# Patient Record
Sex: Female | Born: 1970 | Race: White | Hispanic: Yes | Marital: Married | Smoking: Never smoker
Health system: Southern US, Community
[De-identification: ages and names within clinical notes are randomized; demographics above are authoritative.]

## PROBLEM LIST (undated history)

## (undated) DIAGNOSIS — E119 Type 2 diabetes mellitus without complications: Secondary | ICD-10-CM

## (undated) DIAGNOSIS — E785 Hyperlipidemia, unspecified: Secondary | ICD-10-CM

---

## 2007-11-20 ENCOUNTER — Ambulatory Visit (HOSPITAL_COMMUNITY): Admission: RE | Admit: 2007-11-20 | Discharge: 2007-11-20 | Payer: Self-pay | Admitting: Obstetrics and Gynecology

## 2007-12-26 ENCOUNTER — Ambulatory Visit (HOSPITAL_COMMUNITY): Admission: RE | Admit: 2007-12-26 | Discharge: 2007-12-26 | Payer: Self-pay | Admitting: Obstetrics and Gynecology

## 2008-01-23 ENCOUNTER — Ambulatory Visit (HOSPITAL_COMMUNITY): Admission: RE | Admit: 2008-01-23 | Discharge: 2008-01-23 | Payer: Self-pay | Admitting: Obstetrics and Gynecology

## 2010-10-22 ENCOUNTER — Encounter: Payer: Self-pay | Admitting: *Deleted

## 2010-10-22 ENCOUNTER — Encounter: Payer: Self-pay | Admitting: Obstetrics and Gynecology

## 2019-02-22 ENCOUNTER — Emergency Department (HOSPITAL_COMMUNITY): Payer: HRSA Program

## 2019-02-22 ENCOUNTER — Encounter (HOSPITAL_COMMUNITY): Payer: Self-pay

## 2019-02-22 ENCOUNTER — Emergency Department (HOSPITAL_COMMUNITY)
Admission: EM | Admit: 2019-02-22 | Discharge: 2019-02-22 | Disposition: A | Discharge status: Home / Self Care | Payer: HRSA Program | Attending: Emergency Medicine | Admitting: Emergency Medicine

## 2019-02-22 ENCOUNTER — Other Ambulatory Visit: Payer: Self-pay

## 2019-02-22 DIAGNOSIS — R079 Chest pain, unspecified: Secondary | ICD-10-CM | POA: Diagnosis present

## 2019-02-22 DIAGNOSIS — Z7984 Long term (current) use of oral hypoglycemic drugs: Secondary | ICD-10-CM | POA: Diagnosis not present

## 2019-02-22 DIAGNOSIS — J1289 Other viral pneumonia: Secondary | ICD-10-CM | POA: Insufficient documentation

## 2019-02-22 DIAGNOSIS — Z79899 Other long term (current) drug therapy: Secondary | ICD-10-CM | POA: Diagnosis not present

## 2019-02-22 DIAGNOSIS — E119 Type 2 diabetes mellitus without complications: Secondary | ICD-10-CM | POA: Insufficient documentation

## 2019-02-22 DIAGNOSIS — U071 COVID-19: Secondary | ICD-10-CM | POA: Insufficient documentation

## 2019-02-22 DIAGNOSIS — Z7982 Long term (current) use of aspirin: Secondary | ICD-10-CM | POA: Diagnosis not present

## 2019-02-22 DIAGNOSIS — J129 Viral pneumonia, unspecified: Secondary | ICD-10-CM

## 2019-02-22 DIAGNOSIS — Z20822 Contact with and (suspected) exposure to covid-19: Secondary | ICD-10-CM

## 2019-02-22 HISTORY — DX: Type 2 diabetes mellitus without complications: E11.9

## 2019-02-22 HISTORY — DX: Hyperlipidemia, unspecified: E78.5

## 2019-02-22 LAB — CBC
HCT: 44.2 % (ref 36.0–46.0)
Hemoglobin: 14.8 g/dL (ref 12.0–15.0)
MCH: 28.8 pg (ref 26.0–34.0)
MCHC: 33.5 g/dL (ref 30.0–36.0)
MCV: 86.2 fL (ref 80.0–100.0)
Platelets: 156 K/uL (ref 150–400)
RBC: 5.13 MIL/uL — ABNORMAL HIGH (ref 3.87–5.11)
RDW: 15.1 % (ref 11.5–15.5)
WBC: 4.7 K/uL (ref 4.0–10.5)
nRBC: 0 % (ref 0.0–0.2)

## 2019-02-22 LAB — I-STAT BETA HCG BLOOD, ED (MC, WL, AP ONLY): I-stat hCG, quantitative: <5 m[IU]/mL

## 2019-02-22 LAB — BASIC METABOLIC PANEL WITH GFR
Anion gap: 11 (ref 5–15)
BUN: 9 mg/dL (ref 6–20)
CO2: 22 mmol/L (ref 22–32)
Calcium: 9.1 mg/dL (ref 8.9–10.3)
Chloride: 106 mmol/L (ref 98–111)
Creatinine, Ser: 0.66 mg/dL (ref 0.44–1.00)
GFR calc Af Amer: >60 mL/min
GFR calc non Af Amer: >60 mL/min
Glucose, Bld: 115 mg/dL — ABNORMAL HIGH (ref 70–99)
Potassium: 3.7 mmol/L (ref 3.5–5.1)
Sodium: 139 mmol/L (ref 135–145)

## 2019-02-22 LAB — TROPONIN I: Troponin I: <0.03 ng/mL

## 2019-02-22 LAB — D-DIMER, QUANTITATIVE: D-Dimer, Quant: 0.56 ug{FEU}/mL — ABNORMAL HIGH (ref 0.00–0.50)

## 2019-02-22 MED ORDER — ALUM & MAG HYDROXIDE-SIMETH 200-200-20 MG/5ML PO SUSP
15.0000 mL | Freq: Once | ORAL | Status: AC
  Administered 2019-02-22: 15 mL via ORAL
  Filled 2019-02-22: qty 30

## 2019-02-22 MED ORDER — SODIUM CHLORIDE 0.9% FLUSH: 3.0000 mL | Freq: Once | INTRAVENOUS | Status: DC

## 2019-02-22 MED ORDER — DOXYCYCLINE HYCLATE 100 MG PO CAPS: 100.0000 mg | ORAL_CAPSULE | Freq: Two times a day (BID) | ORAL | 0 refills | Status: AC

## 2019-02-22 MED ORDER — ACETAMINOPHEN 500 MG PO TABS
1000.0000 mg | ORAL_TABLET | Freq: Once | ORAL | Status: AC
  Administered 2019-02-22: 1000 mg via ORAL
  Filled 2019-02-22: qty 2

## 2019-02-22 MED ORDER — IOHEXOL 350 MG/ML SOLN
75.0000 mL | Freq: Once | INTRAVENOUS | Status: AC | PRN
  Administered 2019-02-22: 17:00:00 100 mL via INTRAVENOUS

## 2019-02-22 NOTE — ED Provider Notes (Signed)
[facility] Provider Note   CSN: [id number] Arrival date & time: 02/22/19  1429    History   Chief Complaint Chief Complaint  Patient presents with   Chest Pain    HPI Virginia SimmeringMaria Virginia Suezanne Jacquetorres Le is a 48 y.o. female.     48 yo F with a cc of chest pain.  Worse with twisting bending palpation.  Denies trauma, going on for the past 4 days. No cough, fever.   The history is provided by the patient.  Chest Pain  Pain location:  Substernal area Pain quality: sharp   Pain radiates to:  Does not radiate Pain severity:  Moderate Onset quality:  Gradual Duration:  4 days Timing:  Constant Progression:  Worsening Chronicity:  New Relieved by:  Nothing Worsened by:  Nothing Ineffective treatments:  None tried Associated symptoms: no dizziness, no fever, no headache, no nausea, no palpitations, no shortness of breath and no vomiting     Past Medical History:  Diagnosis Date   Diabetes mellitus without complication (HCC)    Hyperlipidemia     There are no active problems to display for this patient.   Past Surgical History:  Procedure Laterality Date   CESAREAN SECTION       OB History   No obstetric history on file.      Home Medications    Prior to Admission medications   Medication Sig Start Date End Date Taking? Authorizing Provider  ASPIRIN ADULT LOW STRENGTH 81 MG EC tablet Take 81 mg by mouth daily. 11/27/18  Yes [provider]  atorvastatin (LIPITOR) 80 MG tablet Take 80 mg by mouth every evening. 11/27/18  Yes [provider]  metFORMIN (GLUCOPHAGE) 500 MG tablet Take 500 mg by mouth 2 (two) times a day.   Yes [provider]  doxycycline (VIBRAMYCIN) 100 MG capsule Take 1 capsule (100 mg total) by mouth 2 (two) times daily. 02/22/19   Melene PlanFloyd, Jose Corvin, DO    Family History History reviewed. No pertinent family history.  Social History Social History   Tobacco Use   Smoking  status: Never Smoker   Smokeless tobacco: Never Used  Substance Use Topics   Alcohol use: Never    Frequency: Never   Drug use: Never     Allergies   Patient has no known allergies.   Review of Systems Review of Systems  Constitutional: Negative for chills and fever.  HENT: Negative for congestion and rhinorrhea.   Eyes: Negative for redness and visual disturbance.  Respiratory: Negative for shortness of breath and wheezing.   Cardiovascular: Positive for chest pain. Negative for palpitations.  Gastrointestinal: Negative for nausea and vomiting.  Genitourinary: Negative for dysuria and urgency.  Musculoskeletal: Negative for arthralgias and myalgias.  Skin: Negative for pallor and wound.  Neurological: Negative for dizziness and headaches.     Physical Exam Updated Vital Signs BP 112/75    Pulse 84    Temp 98.5 F (36.9 C) (Oral)    Resp (!) 21    Ht 5' (1.524 m)    Wt 74.8 kg    LMP 12/21/2018    SpO2 94%    BMI 32.22 kg/m   Physical Exam Vitals signs and nursing note reviewed.  Constitutional:      General: She is not in acute distress.    Appearance: She is well-developed. She is not diaphoretic.  HENT:     Head: Normocephalic and atraumatic.  Eyes:     Pupils:  Pupils are equal, round, and reactive to light.  Neck:     Musculoskeletal: Normal range of motion and neck supple.  Cardiovascular:     Rate and Rhythm: Normal rate and regular rhythm.     Heart sounds: No murmur. No friction rub. No gallop.   Pulmonary:     Effort: Pulmonary effort is normal.     Breath sounds: No wheezing or rales.  Chest:     Chest wall: Tenderness present.     Comments: Palpation of the anterior chest wall reproduces the patients symptoms.  Abdominal:     General: There is no distension.     Palpations: Abdomen is soft.     Tenderness: There is no abdominal tenderness.  Musculoskeletal:        General: No tenderness.  Skin:    General: Skin is warm and dry.    Neurological:     Mental Status: She is alert and oriented to person, place, and time.  Psychiatric:        Behavior: Behavior normal.      ED Treatments / Results  Labs (all labs ordered are listed, but only abnormal results are displayed) Labs Reviewed  BASIC METABOLIC PANEL - Abnormal; Notable for the following components:      Result Value   Glucose, Bld 115 (*)    All other components within normal limits  CBC - Abnormal; Notable for the following components:   RBC 5.13 (*)    All other components within normal limits  D-DIMER, QUANTITATIVE (NOT AT [facility]) - Abnormal; Notable for the following components:   D-Dimer, Quant 0.56 (*)    All other components within normal limits  NOVEL CORONAVIRUS, NAA (HOSPITAL ORDER, SEND-OUT TO REF LAB)  TROPONIN I  I-STAT BETA HCG BLOOD, ED ([facility], [facility], [facility] ONLY)    EKG EKG Interpretation  Date/Time:  Sunday Feb 22 2019 14:36:46 EDT Ventricular Rate:  105 PR Interval:  122 QRS Duration: 76 QT Interval:  350 QTC Calculation: 462 R Axis:   13 Text Interpretation:  Sinus tachycardia Cannot rule out Anterior infarct , age undetermined Abnormal ECG No old tracing to compare Confirmed by Melene Plan [phone]) on 02/22/2019 3:16:45 PM   Radiology Dg Chest 2 View  Result Date: 02/22/2019 CLINICAL DATA:  Chest pain EXAM: CHEST - 2 VIEW COMPARISON:  None. FINDINGS: There is airspace opacity in both lower lobes, more pronounced on the left than on the right and in the right mid lung. Heart size and pulmonary vascularity are normal. No adenopathy. No bone lesions. IMPRESSION: Multifocal pneumonia, with consolidation most notable in the left base but with infiltrate also present in the right mid and lower lung zones. Heart size and pulmonary vascularity are normal. No adenopathy. Followup PA and lateral chest radiographs recommended in 3-4 weeks following trial of antibiotic therapy to ensure resolution and exclude underlying malignancy. Electronically  Signed   By: Bretta Bang III M.D.   On: 02/22/2019 15:54   Ct Angio Chest Pe W And/or Wo Contrast  Result Date: 02/22/2019 CLINICAL DATA:  Chest pain, fatigue, headache EXAM: CT ANGIOGRAPHY CHEST WITH CONTRAST TECHNIQUE: Multidetector CT imaging of the chest was performed using the standard protocol during bolus administration of intravenous contrast. Multiplanar CT image reconstructions and MIPs were obtained to evaluate the vascular anatomy. CONTRAST:  OMNIPAQUE IOHEXOL 350 MG/ML SOLN COMPARISON:  None. FINDINGS: Cardiovascular: Heart size normal. No pericardial effusion. Satisfactory opacification of pulmonary arteries noted, and there is no evidence of pulmonary  emboli. Motion degrades some of the images through the lung bases. Adequate contrast opacification of the thoracic aorta with no evidence of dissection, aneurysm, or stenosis. There is classic 3-vessel brachiocephalic arch anatomy without proximal stenosis. Mediastinum/Nodes: No hilar or mediastinal adenopathy. Lungs/Pleura: No effusion. No pneumothorax. Focal airspace opacities in the posterior upper lobes right worse than left, and in both lower lobes left worse than right. Upper Abdomen: No acute findings. Musculoskeletal: No chest wall abnormality. No acute or significant osseous findings. Review of the MIP images confirms the above findings. IMPRESSION: 1. Negative for acute PE or thoracic aortic dissection. 2. Focal bilateral asymmetric airspace opacities , suggesting multifocal pneumonia. Followup imaging is recommended in 3-4 weeks following trial of antibiotic therapy to ensure resolution and exclude underlying malignancy. Electronically Signed   By: Corlis Leak M.D.   On: 02/22/2019 17:37    Procedures Procedures (including critical care time)  Medications Ordered in ED Medications  sodium chloride flush (NS) 0.9 % injection 3 mL (has no administration in time range)  acetaminophen (TYLENOL) tablet 1,000 mg (1,000 mg  Oral Given 02/22/19 1609)  alum & mag hydroxide-simeth (MAALOX/MYLANTA) 200-200-20 MG/5ML suspension 15 mL (15 mLs Oral Given 02/22/19 1609)  iohexol (OMNIPAQUE) 350 MG/ML injection 75 mL (100 mLs Intravenous Contrast Given 02/22/19 1710)     Initial Impression / Assessment and Plan / ED Course  I have reviewed the triage vital signs and the nursing notes.  Pertinent labs & imaging results that were available during my care of the patient were reviewed by me and considered in my medical decision making (see chart for details).        48 yo F with a cc of chest pain.  Atypical in nature.  Reproduced with palpation.  Likely muscular.  Workup ordered in triage.   The patient ended up having a mildly elevated d-dimer so a CT angiogram of the chest was ordered.  This is negative for pulmonary embolism but shows multifocal pneumonia.  As we are currently in the midst of a coronavirus pandemic I must consider this is a possibility.  We will start her on doxycycline.  Send an outpatient test.  Have her return for worsening shortness of breath.  Have her continue to self isolate at home.  Virginia Le was evaluated in Emergency Department on 02/22/2019 for the symptoms described in the history of present illness. He/she was evaluated in the context of the global COVID-19 pandemic, which necessitated consideration that the patient might be at risk for infection with the SARS-CoV-2 virus that causes COVID-19. Institutional protocols and algorithms that pertain to the evaluation of patients at risk for COVID-19 are in a state of rapid change based on information released by regulatory bodies including the CDC and federal and state organizations. These policies and algorithms were followed during the patient's care in the ED.  7:13 PM:  I have discussed the diagnosis/risks/treatment options with the patient and believe the pt to be eligible for discharge home to follow-up with PCP. We also  discussed returning to the ED immediately if new or worsening sx occur. We discussed the sx which are most concerning (e.g., sudden worsening sob, fever, inability to tolerate by mouth) that necessitate immediate return. Medications administered to the patient during their visit and any new prescriptions provided to the patient are listed below.  Medications given during this visit Medications  sodium chloride flush (NS) 0.9 % injection 3 mL (has no administration in time range)  acetaminophen (TYLENOL)  tablet 1,000 mg (1,000 mg Oral Given 02/22/19 1609)  alum & mag hydroxide-simeth (MAALOX/MYLANTA) 200-200-20 MG/5ML suspension 15 mL (15 mLs Oral Given 02/22/19 1609)  iohexol (OMNIPAQUE) 350 MG/ML injection 75 mL (100 mLs Intravenous Contrast Given 02/22/19 1710)     The patient appears reasonably screen and/or stabilized for discharge and I doubt any other medical condition or other [facility] requiring further screening, evaluation, or treatment in the ED at this time prior to discharge.    Final Clinical Impressions(s) / ED Diagnoses   Final diagnoses:  Viral pneumonia  Suspected Covid-19 Virus Infection    ED Discharge Orders         Ordered    doxycycline (VIBRAMYCIN) 100 MG capsule  2 times daily     02/22/19 1906           Melene Plan, DO 02/22/19 1913

## 2019-02-22 NOTE — Discharge Instructions (Signed)
Person Under Monitoring Name: Virginia Le  Location: [address] [place] [address]   Infection Prevention Recommendations for Individuals Confirmed to have, or Being Evaluated for, 2019 Novel Coronavirus (COVID-19) Infection Who Receive Care at Home  Individuals who are confirmed to have, or are being evaluated for, COVID-19 should follow the prevention steps below until a healthcare provider or local or state health department says they can return to normal activities.  Stay home except to get medical care You should restrict activities outside your home, except for getting medical care. Do not go to work, school, or public areas, and do not use public transportation or taxis.  Call ahead before visiting your doctor Before your medical appointment, call the healthcare provider and tell them that you have, or are being evaluated for, COVID-19 infection. This will help the healthcare providers office take steps to keep other people from getting infected. Ask your healthcare provider to call the local or state health department.  Monitor your symptoms Seek prompt medical attention if your illness is worsening (e.g., difficulty breathing). Before going to your medical appointment, call the healthcare provider and tell them that you have, or are being evaluated for, COVID-19 infection. Ask your healthcare provider to call the local or state health department.  Wear a facemask You should wear a facemask that covers your nose and mouth when you are in the same room with other people and when you visit a healthcare provider. People who live with or visit you should also wear a facemask while they are in the same room with you.  Separate yourself from other people in your home As much as possible, you should stay in a different room from other people in your home. Also, you should use a separate bathroom, if available.  Avoid sharing household items You  should not share dishes, drinking glasses, cups, eating utensils, towels, bedding, or other items with other people in your home. After using these items, you should wash them thoroughly with soap and water.  Cover your coughs and sneezes Cover your mouth and nose with a tissue when you cough or sneeze, or you can cough or sneeze into your sleeve. Throw used tissues in a lined trash can, and immediately wash your hands with soap and water for at least 20 seconds or use an alcohol-based hand rub.  Wash your [employer] your hands often and thoroughly with soap and water for at least 20 seconds. You can use an alcohol-based hand sanitizer if soap and water are not available and if your hands are not visibly dirty. Avoid touching your eyes, nose, and mouth with unwashed hands.   Prevention Steps for Caregivers and Household Members of Individuals Confirmed to have, or Being Evaluated for, COVID-19 Infection Being Cared for in the Home  If you live with, or provide care at home for, a person confirmed to have, or being evaluated for, COVID-19 infection please follow these guidelines to prevent infection:  Follow healthcare providers instructions Make sure that you understand and can help the patient follow any healthcare provider instructions for all care.  Provide for the patients basic needs You should help the patient with basic needs in the home and provide support for getting groceries, prescriptions, and other personal needs.  Monitor the patients symptoms If they are getting sicker, call his or her medical provider and tell them that the patient has, or is being evaluated for, COVID-19 infection. This will help the healthcare  providers office take steps to keep other people from getting infected. Ask the healthcare provider to call the local or state health department.  Limit the number of people who have contact with the patient If possible, have only one caregiver for the  patient. Other household members should stay in another home or place of residence. If this is not possible, they should stay in another room, or be separated from the patient as much as possible. Use a separate bathroom, if available. Restrict visitors who do not have an essential need to be in the home.  Keep older adults, very young children, and other sick people away from the patient Keep older adults, very young children, and those who have compromised immune systems or chronic health conditions away from the patient. This includes people with chronic heart, lung, or kidney conditions, diabetes, and cancer.  Ensure good ventilation Make sure that shared spaces in the home have good air flow, such as from an air conditioner or an opened window, weather permitting.  Wash your hands often Wash your hands often and thoroughly with soap and water for at least 20 seconds. You can use an alcohol based hand sanitizer if soap and water are not available and if your hands are not visibly dirty. Avoid touching your eyes, nose, and mouth with unwashed hands. Use disposable paper towels to dry your hands. If not available, use dedicated cloth towels and replace them when they become wet.  Wear a facemask and gloves Wear a disposable facemask at all times in the room and gloves when you touch or have contact with the patients blood, body fluids, and/or secretions or excretions, such as sweat, saliva, sputum, nasal mucus, vomit, urine, or feces.  Ensure the mask fits over your nose and mouth tightly, and do not touch it during use. Throw out disposable facemasks and gloves after using them. Do not reuse. Wash your hands immediately after removing your facemask and gloves. If your personal clothing becomes contaminated, carefully remove clothing and launder. Wash your hands after handling contaminated clothing. Place all used disposable facemasks, gloves, and other waste in a lined container before  disposing them with other household waste. Remove gloves and wash your hands immediately after handling these items.  Do not share dishes, glasses, or other household items with the patient Avoid sharing household items. You should not share dishes, drinking glasses, cups, eating utensils, towels, bedding, or other items with a patient who is confirmed to have, or being evaluated for, COVID-19 infection. After the person uses these items, you should wash them thoroughly with soap and water.  Wash laundry thoroughly Immediately remove and wash clothes or bedding that have blood, body fluids, and/or secretions or excretions, such as sweat, saliva, sputum, nasal mucus, vomit, urine, or feces, on them. Wear gloves when handling laundry from the patient. Read and follow directions on labels of laundry or clothing items and detergent. In general, wash and dry with the warmest temperatures recommended on the label.  Clean all areas the individual has used often Clean all touchable surfaces, such as counters, tabletops, doorknobs, bathroom fixtures, toilets, phones, keyboards, tablets, and bedside tables, every day. Also, clean any surfaces that may have blood, body fluids, and/or secretions or excretions on them. Wear gloves when cleaning surfaces the patient has come in contact with. Use a diluted bleach solution (e.g., dilute bleach with 1 part bleach and 10 parts water) or a household disinfectant with a label that says EPA-registered for coronaviruses. To make  a bleach solution at home, add 1 tablespoon of bleach to 1 quart (4 cups) of water. For a larger supply, add  cup of bleach to 1 gallon (16 cups) of water. Read labels of cleaning products and follow recommendations provided on product labels. Labels contain instructions for safe and effective use of the cleaning product including precautions you should take when applying the product, such as wearing gloves or eye protection and making sure you  have good ventilation during use of the product. Remove gloves and wash hands immediately after cleaning.  Monitor yourself for signs and symptoms of illness Caregivers and household members are considered close contacts, should monitor their health, and will be asked to limit movement outside of the home to the extent possible. Follow the monitoring steps for close contacts listed on the symptom monitoring form.   ? If you have additional questions, contact your local health department or call the epidemiologist on call at [phone] (available 24/7). ? This guidance is subject to change. For the most up-to-date guidance from [facility], please refer to their website: [email or web address].pl

## 2019-02-22 NOTE — ED Notes (Signed)
Patient verbalizes understanding of discharge instructions. Opportunity for questioning and answers were provided. Armband removed by staff, pt discharged from ED ambulatory with husband to transport pt home   

## 2019-02-22 NOTE — ED Triage Notes (Signed)
Pt arrives POV for eval of chest pain, fatigue, headache. Pt reports onset Wednesday. Pt denies known sick contacts. States CP is worse with deep breath.

## 2019-02-26 LAB — NOVEL CORONAVIRUS, NAA (HOSP ORDER, SEND-OUT TO REF LAB; TAT 18-24 HRS): SARS-CoV-2, NAA: DETECTED — AB

## 2020-03-14 IMAGING — CT CT ANGIOGRAPHY CHEST
2 of 7 series · 18 of 46 positions shown · IV contrast (omnipaque)
Comparison: None.

CLINICAL DATA: Chest pain, fatigue, headache

EXAM:
CT ANGIOGRAPHY CHEST WITH CONTRAST
TECHNIQUE: Multidetector CT imaging of the chest was performed using the
standard protocol during bolus administration of intravenous
contrast. Multiplanar CT image reconstructions and MIPs were
obtained to evaluate the vascular anatomy.
CONTRAST:  100mL OMNIPAQUE IOHEXOL 350 MG/ML SOLN

[Series 6: thins · axial · 0.73mm/px · z∈[+1162,+1374]mm · 15 of 341 slices shown]
[im 19/341  lung]
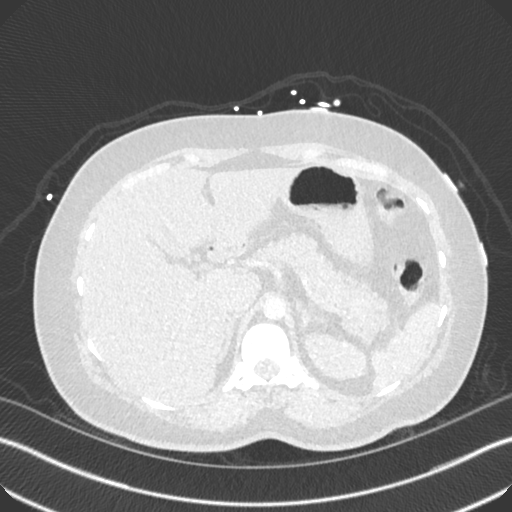
[im 38/341  soft-tissue]
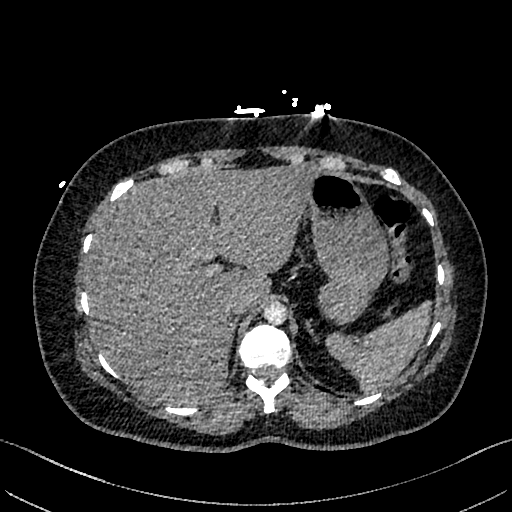
[im 57/341  lung]
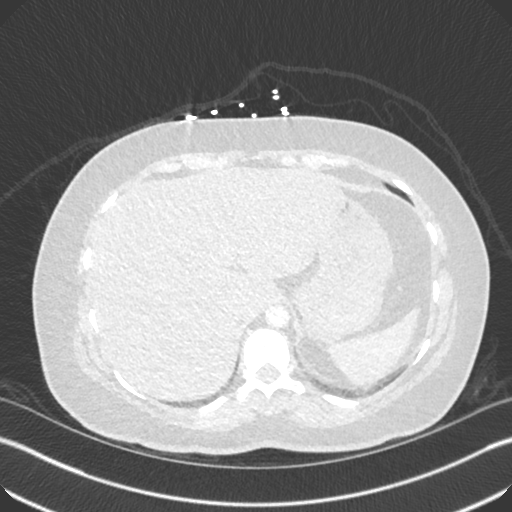
[im 76/341  soft-tissue]
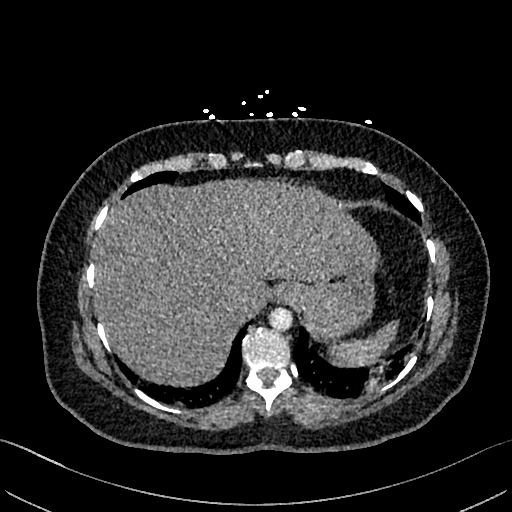
[im 114/341  lung]
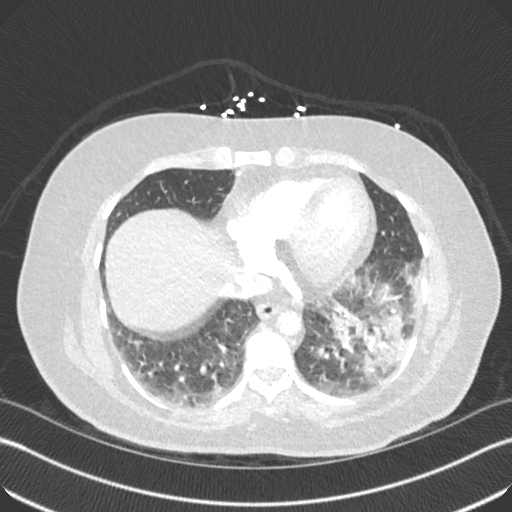
[im 133/341  soft-tissue]
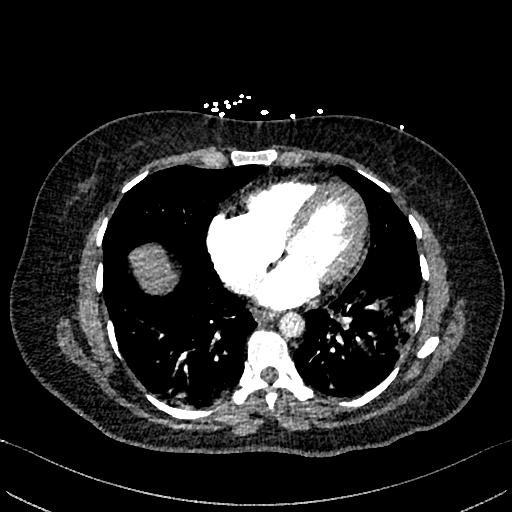
[im 152/341  lung]
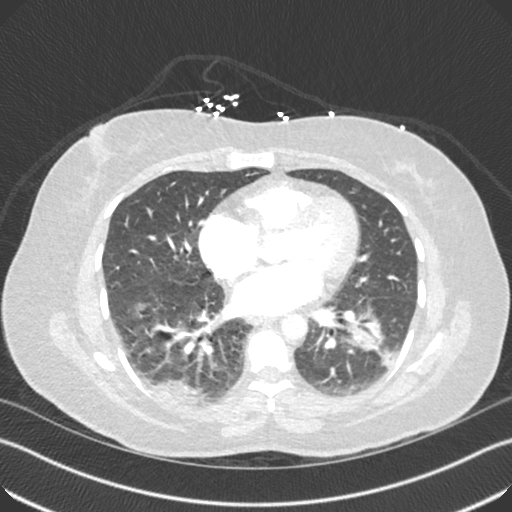
[im 171/341  soft-tissue]
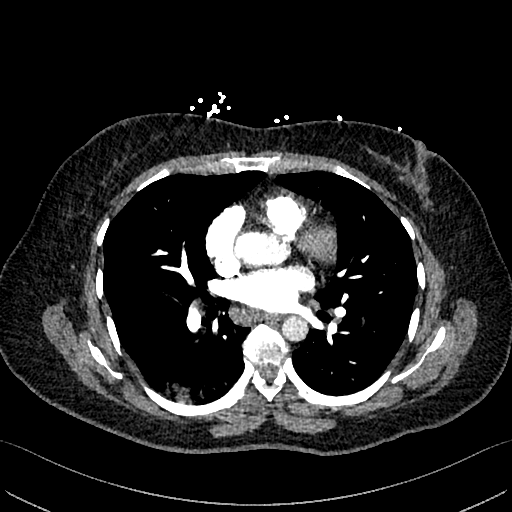
[im 189/341  lung]
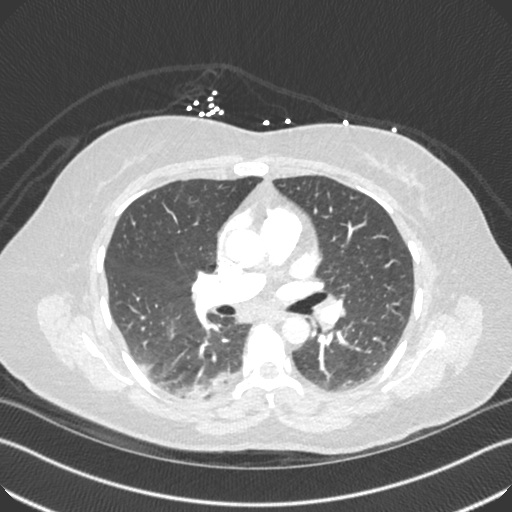
[im 208/341  soft-tissue]
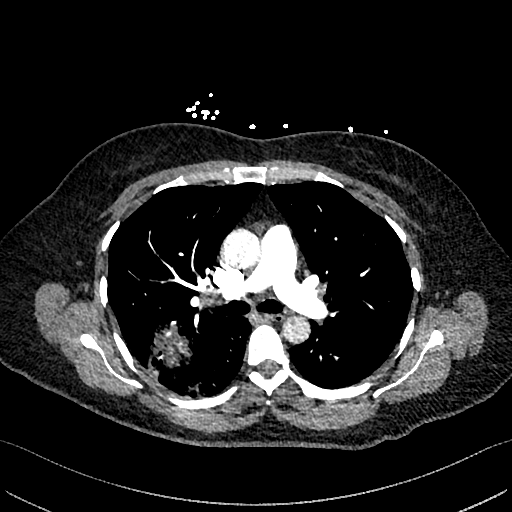
[im 227/341  lung]
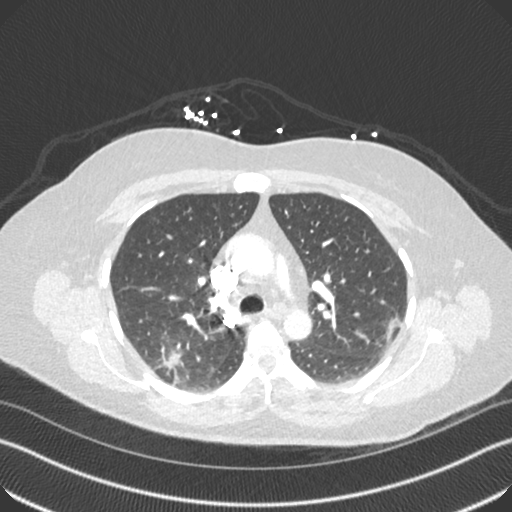
[im 265/341  soft-tissue]
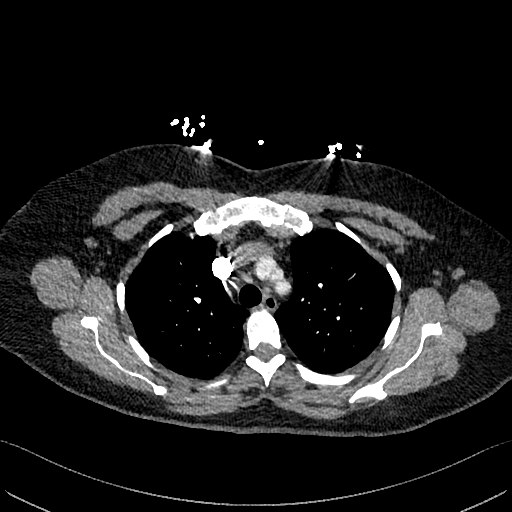
[im 284/341  lung]
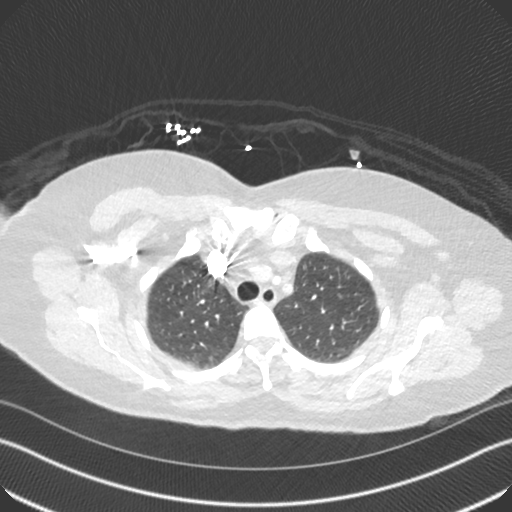
[im 303/341  soft-tissue]
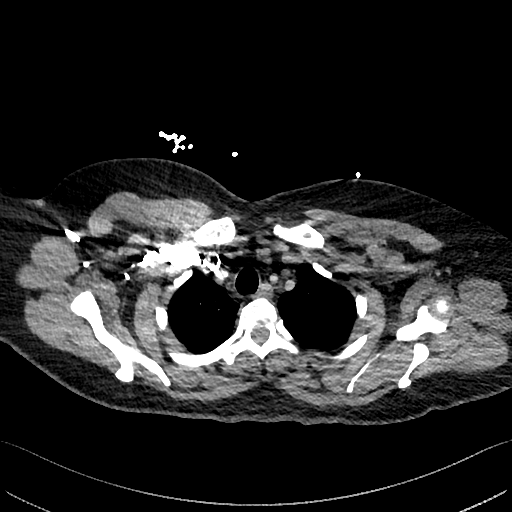
[im 322/341  lung]
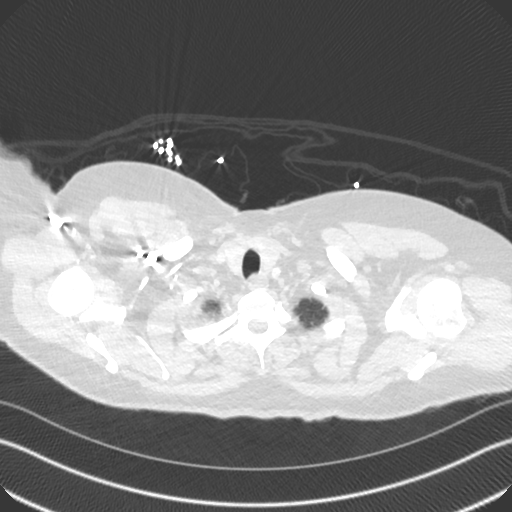

[Series 8: cor · coronal · 0.55mm/px · 3 of 135 slices shown]
[im 34/135  soft-tissue]
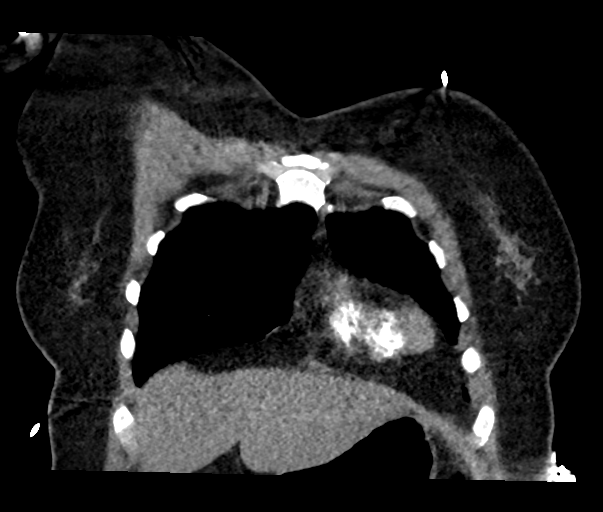
[im 68/135  soft-tissue]
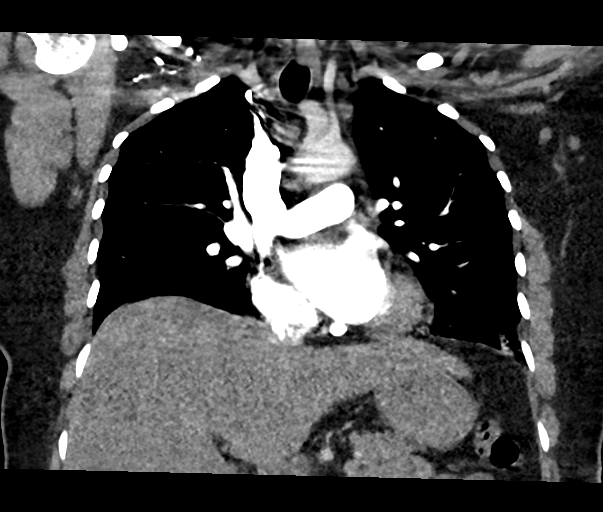
[im 101/135  soft-tissue]
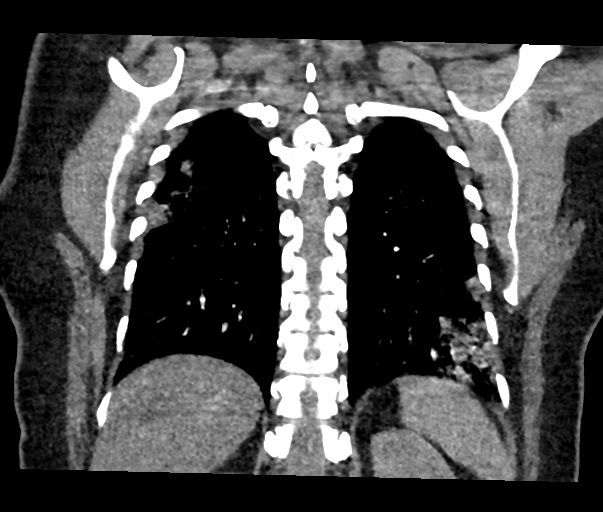

[18 of 46 positions shown; findings below may reference images not displayed]

FINDINGS: Cardiovascular: Heart size normal. No pericardial effusion.
Satisfactory opacification of pulmonary arteries noted, and there is
no evidence of pulmonary emboli. Motion degrades some of the images
through the lung bases. Adequate contrast opacification of the
thoracic aorta with no evidence of dissection, aneurysm, or
stenosis. There is classic 3-vessel brachiocephalic arch anatomy
without proximal stenosis.

Mediastinum/Nodes: No hilar or mediastinal adenopathy.

Lungs/Pleura: No effusion. No pneumothorax. Focal airspace opacities
in the posterior upper lobes right worse than left, and in both
lower lobes left worse than right.

Upper Abdomen: No acute findings.

Musculoskeletal: No chest wall abnormality. No acute or significant
osseous findings.

Review of the MIP images confirms the above findings.
IMPRESSION: 1. Negative for acute PE or thoracic aortic dissection.
2. Focal bilateral asymmetric airspace opacities , suggesting
multifocal pneumonia.
Followup imaging is recommended in 3-4 weeks following trial of
antibiotic therapy to ensure resolution and exclude underlying
malignancy.

## 2022-11-30 DEATH — deceased
